# Patient Record
Sex: Male | Born: 1985 | Race: White | Hispanic: Yes | Marital: Single | Smoking: Never smoker
Health system: Southern US, Community
[De-identification: ages and names within clinical notes are randomized; demographics above are authoritative.]

## PROBLEM LIST (undated history)

## (undated) DIAGNOSIS — R001 Bradycardia, unspecified: Secondary | ICD-10-CM

## (undated) DIAGNOSIS — I498 Other specified cardiac arrhythmias: Secondary | ICD-10-CM

## (undated) DIAGNOSIS — IMO0002 Reserved for concepts with insufficient information to code with codable children: Secondary | ICD-10-CM

## (undated) HISTORY — DX: Other specified cardiac arrhythmias: I49.8

## (undated) HISTORY — PX: FOOT SURGERY: SHX648

## (undated) HISTORY — PX: KNEE SURGERY: SHX244

## (undated) HISTORY — PX: SPINAL FIXATION SURGERY W/ IMPLANT: SHX785

## (undated) HISTORY — DX: Bradycardia, unspecified: R00.1

---
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
## 2007-02-20 ENCOUNTER — Ambulatory Visit: Payer: Self-pay | Admitting: Internal Medicine

## 2007-03-19 ENCOUNTER — Encounter: Admission: RE | Admit: 2007-03-19 | Discharge: 2007-05-22 | Payer: Self-pay | Admitting: Family Medicine

## 2007-05-09 ENCOUNTER — Ambulatory Visit: Payer: Self-pay | Admitting: Internal Medicine

## 2007-10-11 ENCOUNTER — Ambulatory Visit: Payer: Self-pay | Admitting: Internal Medicine

## 2007-12-04 ENCOUNTER — Ambulatory Visit: Payer: Self-pay | Admitting: Internal Medicine

## 2007-12-04 ENCOUNTER — Encounter: Payer: Self-pay | Admitting: Internal Medicine

## 2007-12-04 LAB — CONVERTED CEMR LAB
ALT: 18 units/L (ref 0–53)
AST: 26 units/L (ref 0–37)
Albumin: 4.7 g/dL (ref 3.5–5.2)
Alkaline Phosphatase: 79 units/L (ref 39–117)
Basophils Absolute: 0 10*3/uL (ref 0.0–0.1)
Basophils Relative: 0 % (ref 0–1)
Eosinophils Absolute: 0.1 10*3/uL (ref 0.0–0.7)
Glucose, Bld: 83 mg/dL (ref 70–99)
LDL Cholesterol: 54 mg/dL (ref 0–99)
MCHC: 32.5 g/dL (ref 30.0–36.0)
MCV: 87.6 fL (ref 78.0–100.0)
Neutro Abs: 6.2 10*3/uL (ref 1.7–7.7)
Neutrophils Relative %: 69 % (ref 43–77)
Platelets: 291 10*3/uL (ref 150–400)
Potassium: 4.2 meq/L (ref 3.5–5.3)
RBC: 5.55 M/uL (ref 4.22–5.81)
RDW: 13.5 % (ref 11.5–15.5)
Sed Rate: 2 mm/hr (ref 0–16)
Sodium: 141 meq/L (ref 135–145)
Total Bilirubin: 0.5 mg/dL (ref 0.3–1.2)
Total Protein: 7.5 g/dL (ref 6.0–8.3)
Triglycerides: 57 mg/dL (ref <150)
VLDL: 11 mg/dL (ref 0–40)

## 2008-02-27 ENCOUNTER — Ambulatory Visit: Payer: Self-pay | Admitting: Internal Medicine

## 2008-06-29 ENCOUNTER — Emergency Department (HOSPITAL_COMMUNITY): Admission: EM | Admit: 2008-06-29 | Discharge: 2008-06-29 | Payer: Self-pay | Admitting: Emergency Medicine

## 2008-07-28 ENCOUNTER — Ambulatory Visit: Payer: Self-pay | Admitting: Family Medicine

## 2008-08-09 ENCOUNTER — Emergency Department (HOSPITAL_COMMUNITY): Admission: EM | Admit: 2008-08-09 | Discharge: 2008-08-09 | Payer: Self-pay | Admitting: Emergency Medicine

## 2008-10-28 ENCOUNTER — Ambulatory Visit: Payer: Self-pay | Admitting: Internal Medicine

## 2009-04-26 ENCOUNTER — Emergency Department (HOSPITAL_COMMUNITY): Admission: EM | Admit: 2009-04-26 | Discharge: 2009-04-27 | Payer: Self-pay | Admitting: Emergency Medicine

## 2009-05-26 ENCOUNTER — Encounter: Admission: RE | Admit: 2009-05-26 | Discharge: 2009-05-26 | Payer: Self-pay | Admitting: Otolaryngology

## 2009-12-31 ENCOUNTER — Ambulatory Visit: Payer: Self-pay | Admitting: Internal Medicine

## 2010-03-31 ENCOUNTER — Emergency Department (HOSPITAL_COMMUNITY)
Admission: EM | Admit: 2010-03-31 | Discharge: 2010-03-31 | Payer: Self-pay | Source: Home / Self Care | Admitting: Emergency Medicine

## 2011-04-24 ENCOUNTER — Emergency Department (HOSPITAL_COMMUNITY)
Admission: EM | Admit: 2011-04-24 | Discharge: 2011-04-24 | Disposition: A | Discharge status: Home / Self Care | Payer: Medicare Other | Attending: Emergency Medicine | Admitting: Emergency Medicine

## 2011-04-24 ENCOUNTER — Encounter: Payer: Self-pay | Admitting: Emergency Medicine

## 2011-04-24 DIAGNOSIS — H5710 Ocular pain, unspecified eye: Secondary | ICD-10-CM | POA: Insufficient documentation

## 2011-04-24 DIAGNOSIS — H5712 Ocular pain, left eye: Secondary | ICD-10-CM

## 2011-04-24 MED ORDER — FLUORESCEIN SODIUM 1 MG OP STRP
1.0000 | ORAL_STRIP | Freq: Once | OPHTHALMIC | Status: AC
  Administered 2011-04-24: 1 via OPHTHALMIC
  Filled 2011-04-24: qty 1

## 2011-04-24 MED ORDER — TETRACAINE HCL 0.5 % OP SOLN
1.0000 [drp] | Freq: Once | OPHTHALMIC | Status: AC
  Administered 2011-04-24: 1 [drp] via OPHTHALMIC
  Filled 2011-04-24: qty 2

## 2011-04-24 MED ORDER — CARBOXYMETHYLCELLUL-GLYCERIN 0.5-0.9 % OP SOLN: 2.0000 [drp] | OPHTHALMIC | Status: DC | PRN

## 2011-04-24 NOTE — ED Notes (Signed)
Patient currently sitting up in bed; no respiratory or acute distress noted.  Updated patient on plan of care; informed patient that we are waiting for the EDP to re-evaluate patient.  Patient has no other questions or concerns at this time; will continue to monitor.

## 2011-04-24 NOTE — ED Notes (Signed)
To ed for eval of left eye pain. States he has been seen by eye md for same. Also states he fell on Saturday and has left elbow and left knee pain. No decreased rom noted.

## 2011-04-24 NOTE — Progress Notes (Signed)
25 year old male comes in with irritation of his left eye. He had been seen by an eye doctor and placed on some eyedrops which he had been taking to 3 days ago. He continues to have some irritation in his left eye. Exam shows some mild erythema the conjunctiva but no corneal injury. Anterior chamber is clear. Slit-lamp exam is unremarkable. He may have some mild conjunctivitis/resolving keratitis related to his eyedrops. He will be treated symptomatically and referred back to his ophthalmologist.

## 2011-04-24 NOTE — ED Notes (Signed)
Patient given copy of discharge paperwork; went over discharge instructions with patient.  Instructed patient to apply eye drops as directed, to follow up with an eye doctor, and to return to the ED for new, worsening, or concerning symptoms.

## 2011-04-24 NOTE — ED Notes (Signed)
Informed Mahoney, resident that fluorescein and tetracaine at bedside.

## 2011-04-24 NOTE — ED Notes (Signed)
Patient currently sitting up in bed; no respiratory or acute distress noted.  Patient updated on plan of care; informed patient that we are waiting on the EDP to come and assess patient.  Patient has no other questions or concerns at this time.  Will continue to monitor.

## 2011-04-24 NOTE — ED Provider Notes (Signed)
History     CSN: [id number] Arrival date & time: 04/24/2011  3:58 PM   First MD Initiated Contact with Patient 04/24/11 1955      Chief Complaint  Patient presents with  . Eye Pain    (Consider location/radiation/quality/duration/timing/severity/associated sxs/prior treatment) Patient is a 25 y.o. male presenting with eye pain. The history is provided by the patient.  Eye Pain This is a new problem. The current episode started 1 to 4 weeks ago. The problem occurs constantly. The problem has been waxing and waning. Pertinent negatives include no abdominal pain, arthralgias, chest pain, chills, congestion, coughing, fatigue, fever, headaches, myalgias, nausea, neck pain, numbness, rash, visual change or vomiting. The symptoms are aggravated by nothing. Treatments tried: eye drops, prescribed by eye doctor. The treatment provided moderate relief.    No past medical history on file.  No past surgical history on file.  No family history on file.  History  Substance Use Topics  . Smoking status: Not on file  . Smokeless tobacco: Not on file  . Alcohol Use: Not on file      Review of Systems  Unable to perform ROS Constitutional: Negative for fever, chills and fatigue.  HENT: Negative for congestion and neck pain.   Eyes: Positive for pain and redness. Negative for photophobia, discharge, itching and visual disturbance.  Respiratory: Negative for cough and shortness of breath.   Cardiovascular: Negative for chest pain and palpitations.  Gastrointestinal: Negative for nausea, vomiting and abdominal pain.  Musculoskeletal: Negative for myalgias and arthralgias.  Skin: Negative for color change and rash.  Neurological: Negative for light-headedness, numbness and headaches.  All other systems reviewed and are negative.    Allergies  Review of patient's allergies indicates no known allergies.  Home Medications  No current outpatient prescriptions on file.  BP 118/72  Pulse  65  Temp(Src) 98.2 F (36.8 C) (Oral)  Resp 16  SpO2 98%  Physical Exam  Nursing note and vitals reviewed. Constitutional: He is oriented to person, place, and time. He appears well-developed and well-nourished.  HENT:  Head: Normocephalic and atraumatic.  Eyes: Conjunctivae and EOM are normal. Pupils are equal, round, and reactive to light. Right eye exhibits no discharge. Left eye exhibits no discharge and no exudate. No foreign body present in the left eye. No scleral icterus.  Slit lamp exam:      The right eye shows no fluorescein uptake.       The left eye shows no corneal abrasion, no corneal flare, no corneal ulcer, no foreign body, no hyphema and no hypopyon.       Mildly pronounced blood vessels in lateral portion of L eye. Pressure = 13.  Cardiovascular: Normal rate and regular rhythm.   Pulmonary/Chest: Effort normal and breath sounds normal. No respiratory distress.  Abdominal: Soft. There is no tenderness.  Neurological: He is alert and oriented to person, place, and time.  Skin: Skin is warm and dry.  Psychiatric: He has a normal mood and affect.    ED Course  Procedures (including critical care time)  Labs Reviewed - No data to display No results found.   No diagnosis found.    MDM  This is a 25 year old male who presents with left eye pain for approximately 2 weeks. Says 2 weeks ago saw a doctor for it, and was told that he had an infection of unknown type, and was prescribed an unknown eyedrop to help treat it. He said he initially had improvement of his  symptoms, but about a week later began to have recurrence of some pain and discomfort in the left eye. Says he finishes drops 3 days ago, and has not had an acute worsening after that point in time, but does notice some continued worsening of the pain and reported redness in the left eye. The patient denies any other symptoms. On exam the patient does have mildly pronounced blood vessels in the lateral portion of  the left eye, but no conjunctival injection there are no signs of foreign bodies so on exam is negative, but does have fluorescein uptake. There are no signs of periorbital infection, injuries or tenderness on exam. Unsure of exact etiology of patient's pain discomfort, however no signs of infection are seen at this time. Patient was treated with an unknown eyedrop, and certain antibiotic eyedrops the potential to cause irrigation of the eye, which may be the current explanation for the patient's symptoms. Discussed with the patient symptomatic treatment with lubricating ointment, as well as followup with the eye doctor saw him initially, for further evaluation and to ensure he is getting better. The patient expresses understanding and agreement with this plan.        Theotis Burrow, MD 04/25/11 0040

## 2011-04-24 NOTE — ED Notes (Signed)
Patient complaining of eye pain that started two weeks ago; describes pain as "burning"; patient states that when he closes his eye, it feels as if there is something stuck in his eye; reports radiation of pain into his left temporal area.  Patient was seen by a primary care doctor and was given eye drops to help with the irritation; states that the eye drops have provided little relief.  Patient was told by the primary care physician that there were no foreign bodies present in his eye.  Patient states that he is unable to sleep due to the pain and irritation in his eye.  Upon assessment, slight redness noted in sclera of the left eye.  Patient alert and oriented x4; PERRL present.  Will continue to monitor.

## 2011-04-25 NOTE — ED Provider Notes (Signed)
I saw and evaluated the patient, reviewed the resident's note and I agree with the findings and plan.   Dione Booze, MD 04/25/11 [phone]

## 2011-11-11 ENCOUNTER — Encounter (HOSPITAL_COMMUNITY): Payer: Self-pay | Admitting: Emergency Medicine

## 2011-11-11 ENCOUNTER — Emergency Department (HOSPITAL_COMMUNITY)
Admission: EM | Admit: 2011-11-11 | Discharge: 2011-11-12 | Disposition: A | Discharge status: Home / Self Care | Payer: Medicare Other | Attending: Emergency Medicine | Admitting: Emergency Medicine

## 2011-11-11 DIAGNOSIS — R29898 Other symptoms and signs involving the musculoskeletal system: Secondary | ICD-10-CM | POA: Insufficient documentation

## 2011-11-11 DIAGNOSIS — R51 Headache: Secondary | ICD-10-CM | POA: Insufficient documentation

## 2011-11-11 DIAGNOSIS — R209 Unspecified disturbances of skin sensation: Secondary | ICD-10-CM | POA: Insufficient documentation

## 2011-11-11 DIAGNOSIS — X58XXXS Exposure to other specified factors, sequela: Secondary | ICD-10-CM | POA: Insufficient documentation

## 2011-11-11 DIAGNOSIS — IMO0002 Reserved for concepts with insufficient information to code with codable children: Secondary | ICD-10-CM | POA: Insufficient documentation

## 2011-11-11 HISTORY — DX: Reserved for concepts with insufficient information to code with codable children: IMO0002

## 2011-11-11 NOTE — ED Notes (Signed)
Pt reports headache with joint pain and blurred vision denies injury to head also reports intermittant fever

## 2011-11-11 NOTE — ED Notes (Signed)
HA for months, worse on the left side and losing hair on the side that he is having HA, noted some swelling to left upper choc bone. losing vision on left eye Complain of joint pain and it is worse on left arm with some swelling to that left arm.. Forgetting a lot of stuff

## 2011-11-12 ENCOUNTER — Emergency Department (HOSPITAL_COMMUNITY): Payer: Medicare Other

## 2011-11-12 MED ORDER — KETOROLAC TROMETHAMINE 30 MG/ML IJ SOLN
30.0000 mg | Freq: Once | INTRAMUSCULAR | Status: AC
  Administered 2011-11-12: 30 mg via INTRAVENOUS

## 2011-11-12 MED ORDER — DIPHENHYDRAMINE HCL 50 MG/ML IJ SOLN
25.0000 mg | Freq: Once | INTRAMUSCULAR | Status: AC
  Administered 2011-11-12: 25 mg via INTRAVENOUS
  Filled 2011-11-12: qty 1

## 2011-11-12 MED ORDER — DEXAMETHASONE SODIUM PHOSPHATE 10 MG/ML IJ SOLN
10.0000 mg | Freq: Once | INTRAMUSCULAR | Status: AC
  Administered 2011-11-12: 10 mg via INTRAVENOUS
  Filled 2011-11-12: qty 1

## 2011-11-12 MED ORDER — METOCLOPRAMIDE HCL 5 MG/ML IJ SOLN
10.0000 mg | Freq: Once | INTRAMUSCULAR | Status: AC
  Administered 2011-11-12: 10 mg via INTRAVENOUS
  Filled 2011-11-12: qty 2

## 2011-11-12 NOTE — Discharge Instructions (Signed)
You were seen and evaluated for your symptoms of headache. Your CAT scan did not show any concerning cause of your headache in your providers feel you may return home and followup to primary care provider for continued evaluation and treatment.   Headache, General, Unknown Cause The specific cause of your headache may not have been found today. There are many causes and types of headache. A few common ones are:  Tension headache.   Migraine.   Infections (examples: dental and sinus infections).   Bone and/or joint problems in the neck or jaw.   Depression.   Eye problems.  These headaches are not life threatening.  Headaches can sometimes be diagnosed by a patient history and a physical exam. Sometimes, lab and imaging studies (such as x-ray and/or CT scan) are used to rule out more serious problems. In some cases, a spinal tap (lumbar puncture) may be requested. There are many times when your exam and tests may be normal on the first visit even when there is a serious problem causing your headaches. Because of that, it is very important to follow up with your doctor or local clinic for further evaluation. FINDING OUT THE RESULTS OF TESTS  If a radiology test was performed, a radiologist will review your results.   You will be contacted by the emergency department or your physician if any test results require a change in your treatment plan.   Not all test results may be available during your visit. If your test results are not back during the visit, make an appointment with your caregiver to find out the results. Do not assume everything is normal if you have not heard from your caregiver or the medical facility. It is important for you to follow up on all of your test results.  HOME CARE INSTRUCTIONS   Keep follow-up appointments with your caregiver, or any specialist referral.   Only take over-the-counter or prescription medicines for pain, discomfort, or fever as directed by your  caregiver.   Biofeedback, massage, or other relaxation techniques may be helpful.   Ice packs or heat applied to the head and neck can be used. Do this three to four times per day, or as needed.   Call your doctor if you have any questions or concerns.   If you smoke, you should quit.  SEEK MEDICAL CARE IF:   You develop problems with medications prescribed.   You do not respond to or obtain relief from medications.   You have a change from the usual headache.   You develop nausea or vomiting.  SEEK IMMEDIATE MEDICAL CARE IF:   If your headache becomes severe.   You have an unexplained oral temperature above 102 F (38.9 C), or as your caregiver suggests.   You have a stiff neck.   You have loss of vision.   You have muscular weakness.   You have loss of muscular control.   You develop severe symptoms different from your first symptoms.   You start losing your balance or have trouble walking.   You feel faint or pass out.  MAKE SURE YOU:   Understand these instructions.   Will watch your condition.   Will get help right away if you are not doing well or get worse.  Document Released: 05/08/2005 Document Revised: 04/27/2011 Document Reviewed: 12/26/2007 [facility] Patient Information 2012 [place], [place].     RESOURCE GUIDE  Chronic Pain Problems: Contact [facility]  [phone] Patients need to be  referred by their primary care doctor.  Insufficient Money for Medicine: Contact United Way:  call "211" or [facility] [phone].  No Primary Care Doctor: - Call [facility]  [phone] - can help you locate a primary care doctor that  accepts your insurance, provides certain services, etc. - Physician Referral Service- [phone]  Agencies that provide inexpensive medical care: - [facility]  [phone] - [facility]  [phone] - [facility]  [phone] - Women's Clinic   [phone] - Planned Parenthood  [phone] [facility]  [phone]  Medicaid-accepting [facility] Providers: - [facility]- [address]  [phone], Mon-Fri 9am-7pm, Sat 9am-1pm - [facility]- [address] [place], [address] [place]  [phone] - [facility]- [address] [place]  [phone] [facility]- [address]  [phone] - Renaye Rakers- [address] [place], [address], [phone]  Only accepts [place] patients after they have their name  applied to their card  Self Pay (no insurance) in [place]: - Sickle Cell Patients: Dr Willey Blade, [facility]  [address] [place], [phone] - [facility]- [address] [place]  [phone]       [facility]- [address]       -     [facility]- see information above (Speak to [employer] if you do not have insurance)       -  [facility]- [address] [place], [phone]       -  [facility]- [address],  [phone]       -  [facility]- [address], [phone]       -  Dr Julio Sicks-  [address], [place], [phone]       -  [facility]- [address], [phone]       -  [facility]- [address], [phone], also [address] [phone]       -    [facility]- [address] [place], [phone], 1st & 3rd Saturday   every month, 10am-1pm  1) Find a Doctor and Pay Out of Pocket Although you won't have to find out who is covered by your insurance plan, it is a good idea to ask around and get recommendations. You will then need to call the office and see if the doctor you have chosen will accept you as a new patient and what types of options they offer for patients who are self-pay. Some doctors offer discounts or will set up payment plans for their patients  who do not have insurance, but you will need to ask so you aren't surprised when you get to your appointment.  2) Contact Your Local Health Department Not all health departments have doctors that can see patients for sick visits, but many do, so it is worth a call to see if yours does. If you don't know where your local health department is, you can check in your phone book. The CDC also has a tool to help you locate your state's health department, and many state websites also have listings of all of their local health departments.  3) Find a Walk-in Clinic If your illness  is not likely to be very severe or complicated, you may want to try a walk in clinic. These are popping up all over the country in pharmacies, drugstores, and shopping centers. They're usually staffed by nurse practitioners or physician assistants that have been trained to treat common illnesses and complaints. They're usually fairly quick and inexpensive. However, if you have serious medical issues or chronic medical problems, these are probably not your best option  STD Testing - [facility] Department of [facility], STD Clinic, [address] [place], phone [phone] or [phone].  Monday - Friday, call for an appointment. [facility] Department of [employer], STD Clinic, [address], [place], phone [phone] or [phone].  Monday - Friday, call for an appointment.  Abuse/Neglect: [facility] Child Abuse Hotline 629 626 5020 [facility] Child Abuse Hotline 978-344-8782 (After Hours)  Emergency Shelter:  [facility] 769-318-2730  Maternity Homes: - [facility] [facility] [phone] - [facility] [phone]  MRSA Hotline #:   9176650190  [facility] Resources  [facility] [place]  United [employer] [facility] Dept. [address]                 [address]          [address] [place] [address]  Blondell Reveal Phone:  [phone]                                  Phone:  [phone]                   Phone:  [phone]  [facility], [phone] - [phone]       -     [facility] in [place], [address],                                  7376156941, [facility] Child Abuse Hotline 714-610-6532 or (561)758-6350 (After Hours)   Behavioral Health Services  Substance Abuse Resources: - [facility] Drug [facility]  6205165420 - [facility] [phone] - [facility] [place] [phone] Floydene Flock [phone] - Residential & Outpatient Substance Abuse Program  [phone]  Psychological Services: [facility]  [phone] Services  [phone] - [facility], [phone] [place]. [address] [place], ACCESS LINE: [phone] or [phone], [employer]  Dental Assistance  If unable to pay or uninsured, contact:  [facility] or [facility] to become qualified for the adult dental clinic.  Patients with Medicaid: [facility] [phone] [address], [phone] [address], [phone]  If unable to pay, or uninsured, contact [facility] [phone]) or [facility] Department [phone] in [place], [phone] in [place]) to become qualified for the adult dental clinic  Other Proofreader Services: - [facility]- [address] [place], [place], [place], [address], [phone], Ext. [phone], 2nd and 4th Thursday of the month at 6:30am.  10 clients each day by appointment, can sometimes see walk-in patients if someone does not show for an appointment. [facility]- [address] Ether Griffins [place],  [place], [address], [phone] - [facility]- [address] [place], [place], [address], [phone] - [place] [facility]- [phone] [facility] Department- [phone] [facility] Department- [phone]

## 2011-11-12 NOTE — ED Provider Notes (Signed)
Medical screening examination/treatment/procedure(s) were performed by non-physician practitioner and as supervising physician I was immediately available for consultation/collaboration.  Breyonna Nault, MD 11/12/11 0730 
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
## 2012-06-17 ENCOUNTER — Encounter: Payer: Self-pay | Admitting: Internal Medicine

## 2012-07-03 ENCOUNTER — Other Ambulatory Visit (HOSPITAL_COMMUNITY): Payer: Self-pay | Admitting: Cardiology

## 2012-07-03 DIAGNOSIS — R0602 Shortness of breath: Secondary | ICD-10-CM

## 2012-07-03 DIAGNOSIS — I493 Ventricular premature depolarization: Secondary | ICD-10-CM

## 2012-07-03 DIAGNOSIS — R002 Palpitations: Secondary | ICD-10-CM

## 2012-07-09 ENCOUNTER — Ambulatory Visit (HOSPITAL_COMMUNITY)
Admission: RE | Admit: 2012-07-09 | Discharge: 2012-07-09 | Disposition: A | Discharge status: Home / Self Care | Payer: Medicare Other | Source: Ambulatory Visit | Attending: Cardiology | Admitting: Cardiology

## 2012-07-09 DIAGNOSIS — R002 Palpitations: Secondary | ICD-10-CM

## 2012-07-09 DIAGNOSIS — I493 Ventricular premature depolarization: Secondary | ICD-10-CM

## 2012-07-09 DIAGNOSIS — R0602 Shortness of breath: Secondary | ICD-10-CM

## 2012-07-09 DIAGNOSIS — I059 Rheumatic mitral valve disease, unspecified: Secondary | ICD-10-CM | POA: Insufficient documentation

## 2012-07-09 NOTE — Progress Notes (Signed)
[facility]   2D echo completed 07/09/2012.   Cindy Olivya Sobol, RDCS  

## 2012-09-17 ENCOUNTER — Encounter: Payer: Self-pay | Admitting: Internal Medicine

## 2012-09-17 ENCOUNTER — Ambulatory Visit (INDEPENDENT_AMBULATORY_CARE_PROVIDER_SITE_OTHER): Payer: Medicare Other | Admitting: Internal Medicine

## 2012-09-17 VITALS — BP 120/71 | HR 60 | Ht 62.0 in | Wt 109.2 lb

## 2012-09-17 DIAGNOSIS — I4949 Other premature depolarization: Secondary | ICD-10-CM

## 2012-09-17 DIAGNOSIS — R002 Palpitations: Secondary | ICD-10-CM

## 2012-09-17 DIAGNOSIS — I493 Ventricular premature depolarization: Secondary | ICD-10-CM

## 2012-09-17 NOTE — Assessment & Plan Note (Signed)
He has maintained NSR.  I suspect the etiology of his symptoms is the PVC's that he has demonstrated on his cardiac monitor. I have discussed the benign nature of the symptoms and have recommended watchful waiting. I have asked him to call for frank syncope.
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
## 2012-09-17 NOTE — Patient Instructions (Addendum)
Your physician recommends that you schedule a follow-up appointment as needed  

## 2012-09-30 ENCOUNTER — Encounter: Payer: Self-pay | Admitting: Internal Medicine

## 2012-10-04 ENCOUNTER — Emergency Department (HOSPITAL_COMMUNITY): Payer: Medicare Other

## 2012-10-04 ENCOUNTER — Emergency Department (HOSPITAL_COMMUNITY)
Admission: EM | Admit: 2012-10-04 | Discharge: 2012-10-04 | Disposition: A | Discharge status: Home / Self Care | Payer: Medicare Other | Attending: Emergency Medicine | Admitting: Emergency Medicine

## 2012-10-04 ENCOUNTER — Encounter (HOSPITAL_COMMUNITY): Payer: Self-pay | Admitting: Cardiology

## 2012-10-04 DIAGNOSIS — S99929A Unspecified injury of unspecified foot, initial encounter: Secondary | ICD-10-CM | POA: Insufficient documentation

## 2012-10-04 DIAGNOSIS — M7989 Other specified soft tissue disorders: Secondary | ICD-10-CM

## 2012-10-04 DIAGNOSIS — S8990XA Unspecified injury of unspecified lower leg, initial encounter: Secondary | ICD-10-CM | POA: Insufficient documentation

## 2012-10-04 DIAGNOSIS — M660 Rupture of popliteal cyst: Secondary | ICD-10-CM

## 2012-10-04 DIAGNOSIS — R296 Repeated falls: Secondary | ICD-10-CM | POA: Insufficient documentation

## 2012-10-04 DIAGNOSIS — Y9389 Activity, other specified: Secondary | ICD-10-CM | POA: Insufficient documentation

## 2012-10-04 DIAGNOSIS — Z87828 Personal history of other (healed) physical injury and trauma: Secondary | ICD-10-CM | POA: Insufficient documentation

## 2012-10-04 DIAGNOSIS — M25561 Pain in right knee: Secondary | ICD-10-CM

## 2012-10-04 DIAGNOSIS — Y929 Unspecified place or not applicable: Secondary | ICD-10-CM | POA: Insufficient documentation

## 2012-10-04 DIAGNOSIS — M7120 Synovial cyst of popliteal space [Baker], unspecified knee: Secondary | ICD-10-CM | POA: Insufficient documentation

## 2012-10-04 MED ORDER — PREDNISONE 20 MG PO TABS: ORAL_TABLET | ORAL | Status: DC

## 2012-10-04 MED ORDER — IBUPROFEN 800 MG PO TABS: 800.0000 mg | ORAL_TABLET | Freq: Three times a day (TID) | ORAL | Status: DC
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
## 2012-10-04 NOTE — Progress Notes (Signed)
VASCULAR LAB PRELIMINARY  PRELIMINARY  PRELIMINARY  PRELIMINARY  Right lower extremity venous duplex completed.    Preliminary report:  Right:  No evidence of DVTor superficial thrombosis. There is an area of mixed echoes extending from the popliteal fossa to the mid calf consistent with a possible ruptured Baker's cyst.  Cecille Mcclusky, RVS 10/04/2012, 1:39 PM

## 2012-10-04 NOTE — ED Notes (Signed)
Pt dc to home. Pt sts understanding to dc instructions. Pt ambulatory to exit without difficulty.  Pt denies need for w/c.  

## 2012-10-04 NOTE — ED Notes (Signed)
Pt reports on Saturday he fell and has been having pain that aches mostly in the right leg. States that he has also had some numbness in both legs intermittently. Reports a shooting pain in the right leg from the knee to the hip.

## 2012-10-04 NOTE — ED Notes (Signed)
Pt returned from vascular and xray

## 2012-10-24 ENCOUNTER — Encounter (HOSPITAL_COMMUNITY): Payer: Self-pay | Admitting: Emergency Medicine

## 2012-10-24 ENCOUNTER — Emergency Department (HOSPITAL_COMMUNITY): Payer: Medicare Other

## 2012-10-24 ENCOUNTER — Emergency Department (HOSPITAL_COMMUNITY)
Admission: EM | Admit: 2012-10-24 | Discharge: 2012-10-24 | Disposition: A | Discharge status: Home / Self Care | Payer: Medicare Other | Attending: Emergency Medicine | Admitting: Emergency Medicine

## 2012-10-24 DIAGNOSIS — R209 Unspecified disturbances of skin sensation: Secondary | ICD-10-CM | POA: Insufficient documentation

## 2012-10-24 DIAGNOSIS — Z8739 Personal history of other diseases of the musculoskeletal system and connective tissue: Secondary | ICD-10-CM | POA: Insufficient documentation

## 2012-10-24 DIAGNOSIS — M25569 Pain in unspecified knee: Secondary | ICD-10-CM | POA: Insufficient documentation

## 2012-10-24 DIAGNOSIS — M25562 Pain in left knee: Secondary | ICD-10-CM

## 2012-10-24 DIAGNOSIS — R296 Repeated falls: Secondary | ICD-10-CM | POA: Insufficient documentation

## 2012-10-24 DIAGNOSIS — Y998 Other external cause status: Secondary | ICD-10-CM | POA: Insufficient documentation

## 2012-10-24 DIAGNOSIS — Z79899 Other long term (current) drug therapy: Secondary | ICD-10-CM | POA: Insufficient documentation

## 2012-10-24 DIAGNOSIS — Y92009 Unspecified place in unspecified non-institutional (private) residence as the place of occurrence of the external cause: Secondary | ICD-10-CM | POA: Insufficient documentation

## 2012-10-24 MED ORDER — NAPROXEN 375 MG PO TABS: 375.0000 mg | ORAL_TABLET | Freq: Two times a day (BID) | ORAL | Status: DC

## 2012-10-24 MED ORDER — IBUPROFEN 200 MG PO TABS
600.0000 mg | ORAL_TABLET | Freq: Once | ORAL | Status: AC
  Administered 2012-10-24: 600 mg via ORAL
  Filled 2012-10-24: qty 3

## 2012-10-24 NOTE — ED Notes (Signed)
Report received from Berkley,RN 

## 2012-10-24 NOTE — ED Notes (Signed)
Pt reports he fell onto the ground 1 week ago and is c/o anterior left leg pain below knee.  Pt is able to move leg, but c/o pain with movement.  Pt is able to ambulate.
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
## 2012-10-24 NOTE — ED Notes (Signed)
PA at bedside.

## 2012-10-26 NOTE — ED Provider Notes (Signed)
Medical screening examination/treatment/procedure(s) were performed by non-physician practitioner and as supervising physician I was immediately available for consultation/collaboration.    Vida Roller, MD 10/26/12 [phone]

## 2013-10-02 ENCOUNTER — Ambulatory Visit: Payer: Medicare Other | Attending: Physician Assistant

## 2013-10-02 DIAGNOSIS — IMO0001 Reserved for inherently not codable concepts without codable children: Secondary | ICD-10-CM | POA: Insufficient documentation

## 2013-10-02 DIAGNOSIS — M6281 Muscle weakness (generalized): Secondary | ICD-10-CM | POA: Insufficient documentation

## 2013-10-02 DIAGNOSIS — M2550 Pain in unspecified joint: Secondary | ICD-10-CM | POA: Diagnosis not present

## 2013-10-02 DIAGNOSIS — R269 Unspecified abnormalities of gait and mobility: Secondary | ICD-10-CM | POA: Diagnosis not present

## 2013-10-09 ENCOUNTER — Ambulatory Visit: Payer: Medicare Other | Admitting: Physical Therapy

## 2013-10-09 DIAGNOSIS — IMO0001 Reserved for inherently not codable concepts without codable children: Secondary | ICD-10-CM | POA: Diagnosis not present

## 2013-10-10 ENCOUNTER — Ambulatory Visit: Payer: Medicare Other

## 2013-10-14 ENCOUNTER — Ambulatory Visit: Payer: Medicare Other | Admitting: Physical Therapy

## 2013-10-16 ENCOUNTER — Ambulatory Visit: Payer: Medicare Other

## 2013-10-16 DIAGNOSIS — IMO0001 Reserved for inherently not codable concepts without codable children: Secondary | ICD-10-CM | POA: Diagnosis not present

## 2013-10-29 ENCOUNTER — Ambulatory Visit: Payer: Medicare Other | Attending: Physician Assistant | Admitting: Physical Therapy

## 2013-10-29 DIAGNOSIS — M2550 Pain in unspecified joint: Secondary | ICD-10-CM | POA: Diagnosis not present

## 2013-10-29 DIAGNOSIS — R269 Unspecified abnormalities of gait and mobility: Secondary | ICD-10-CM | POA: Diagnosis not present

## 2013-10-29 DIAGNOSIS — IMO0001 Reserved for inherently not codable concepts without codable children: Secondary | ICD-10-CM | POA: Diagnosis not present

## 2013-10-29 DIAGNOSIS — M6281 Muscle weakness (generalized): Secondary | ICD-10-CM | POA: Diagnosis not present

## 2013-11-02 ENCOUNTER — Emergency Department (HOSPITAL_COMMUNITY)
Admission: EM | Admit: 2013-11-02 | Discharge: 2013-11-02 | Disposition: A | Discharge status: Home / Self Care | Payer: Medicare Other | Attending: Emergency Medicine | Admitting: Emergency Medicine

## 2013-11-02 ENCOUNTER — Emergency Department (HOSPITAL_COMMUNITY): Payer: Medicare Other

## 2013-11-02 ENCOUNTER — Encounter (HOSPITAL_COMMUNITY): Payer: Self-pay | Admitting: Emergency Medicine

## 2013-11-02 DIAGNOSIS — S8000XA Contusion of unspecified knee, initial encounter: Secondary | ICD-10-CM | POA: Insufficient documentation

## 2013-11-02 DIAGNOSIS — Z792 Long term (current) use of antibiotics: Secondary | ICD-10-CM | POA: Diagnosis not present

## 2013-11-02 DIAGNOSIS — Z87828 Personal history of other (healed) physical injury and trauma: Secondary | ICD-10-CM | POA: Insufficient documentation

## 2013-11-02 DIAGNOSIS — S8990XA Unspecified injury of unspecified lower leg, initial encounter: Secondary | ICD-10-CM | POA: Diagnosis present

## 2013-11-02 DIAGNOSIS — Y929 Unspecified place or not applicable: Secondary | ICD-10-CM | POA: Diagnosis not present

## 2013-11-02 DIAGNOSIS — Y9389 Activity, other specified: Secondary | ICD-10-CM | POA: Insufficient documentation

## 2013-11-02 DIAGNOSIS — W010XXA Fall on same level from slipping, tripping and stumbling without subsequent striking against object, initial encounter: Secondary | ICD-10-CM | POA: Insufficient documentation

## 2013-11-02 DIAGNOSIS — S8002XA Contusion of left knee, initial encounter: Secondary | ICD-10-CM

## 2013-11-02 DIAGNOSIS — W19XXXA Unspecified fall, initial encounter: Secondary | ICD-10-CM

## 2013-11-02 DIAGNOSIS — S8001XA Contusion of right knee, initial encounter: Secondary | ICD-10-CM

## 2013-11-02 DIAGNOSIS — Z791 Long term (current) use of non-steroidal anti-inflammatories (NSAID): Secondary | ICD-10-CM | POA: Diagnosis not present

## 2013-11-02 DIAGNOSIS — S99929A Unspecified injury of unspecified foot, initial encounter: Secondary | ICD-10-CM | POA: Diagnosis present

## 2013-11-02 MED ORDER — IBUPROFEN 400 MG PO TABS
800.0000 mg | ORAL_TABLET | Freq: Once | ORAL | Status: AC
  Administered 2013-11-02: 800 mg via ORAL
  Filled 2013-11-02: qty 2

## 2013-11-02 MED ORDER — IBUPROFEN 800 MG PO TABS: 800.0000 mg | ORAL_TABLET | Freq: Three times a day (TID) | ORAL | Status: DC

## 2013-11-02 NOTE — ED Notes (Signed)
Pt reports he slipped and fell . Pt reports knee pain on the RT after fall. Pt also reports lesser pain on Lt knee.

## 2013-11-02 NOTE — Discharge Instructions (Signed)
Call for a follow up appointment with a Family or Primary Care Provider.  Call your orthopedic specialist for further evaluation of your knee injury. Return if Symptoms worsen.   Take medication as prescribed.  Ice your knees 3-4 times a day.
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
## 2013-11-04 NOTE — ED Provider Notes (Signed)
Medical screening examination/treatment/procedure(s) were performed by non-physician practitioner and as supervising physician I was immediately available for consultation/collaboration.   Shanna CiscoMegan E Docherty, MD 11/04/13 [phone]

## 2013-11-05 ENCOUNTER — Ambulatory Visit: Payer: Medicare Other

## 2013-11-05 DIAGNOSIS — IMO0001 Reserved for inherently not codable concepts without codable children: Secondary | ICD-10-CM | POA: Diagnosis not present

## 2013-11-18 ENCOUNTER — Encounter: Payer: Medicare Other | Admitting: Physical Therapy

## 2013-11-20 ENCOUNTER — Ambulatory Visit: Payer: Medicare Other

## 2013-12-10 ENCOUNTER — Emergency Department (HOSPITAL_COMMUNITY)
Admission: EM | Admit: 2013-12-10 | Discharge: 2013-12-10 | Disposition: A | Discharge status: Home / Self Care | Payer: Medicare Other | Attending: Emergency Medicine | Admitting: Emergency Medicine

## 2013-12-10 ENCOUNTER — Encounter (HOSPITAL_COMMUNITY): Payer: Self-pay | Admitting: Emergency Medicine

## 2013-12-10 DIAGNOSIS — Z87828 Personal history of other (healed) physical injury and trauma: Secondary | ICD-10-CM | POA: Diagnosis not present

## 2013-12-10 DIAGNOSIS — L738 Other specified follicular disorders: Secondary | ICD-10-CM | POA: Diagnosis not present

## 2013-12-10 DIAGNOSIS — L678 Other hair color and hair shaft abnormalities: Secondary | ICD-10-CM | POA: Diagnosis not present

## 2013-12-10 DIAGNOSIS — L739 Follicular disorder, unspecified: Secondary | ICD-10-CM

## 2013-12-10 DIAGNOSIS — R21 Rash and other nonspecific skin eruption: Secondary | ICD-10-CM | POA: Diagnosis present

## 2013-12-10 MED ORDER — MUPIROCIN CALCIUM 2 % EX CREA: 1.0000 "application " | TOPICAL_CREAM | Freq: Two times a day (BID) | CUTANEOUS | Status: AC

## 2013-12-10 MED ORDER — CETIRIZINE HCL 10 MG PO CAPS: 10.0000 mg | ORAL_CAPSULE | Freq: Every evening | ORAL | Status: AC | PRN
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
## 2013-12-10 NOTE — Discharge Instructions (Signed)

## 2013-12-10 NOTE — ED Notes (Signed)
The patient said he began getting rash sincce last week, the bumps are on both side of his back, his chest and also on his legs.  He says they itch and burn.  He says he has been sweating a lot so that might have something to do with it.  He denies having any pets just birds, and he did add that he has been sleeping on the floor.

## 2013-12-11 NOTE — ED Provider Notes (Signed)
Medical screening examination/treatment/procedure(s) were performed by non-physician practitioner and as supervising physician I was immediately available for consultation/collaboration.   EKG Interpretation None       Jeremy SouSam Samentha Perham, MD 12/11/13 [phone]

## 2013-12-24 IMAGING — CR DG KNEE COMPLETE 4+V*L*
4 series · 4 of 4 positions shown · non-contrast
Comparison: 06/29/2008..

CLINICAL DATA: Fall.

LEFT KNEE - COMPLETE 4+ VIEW

[t knee ap left]
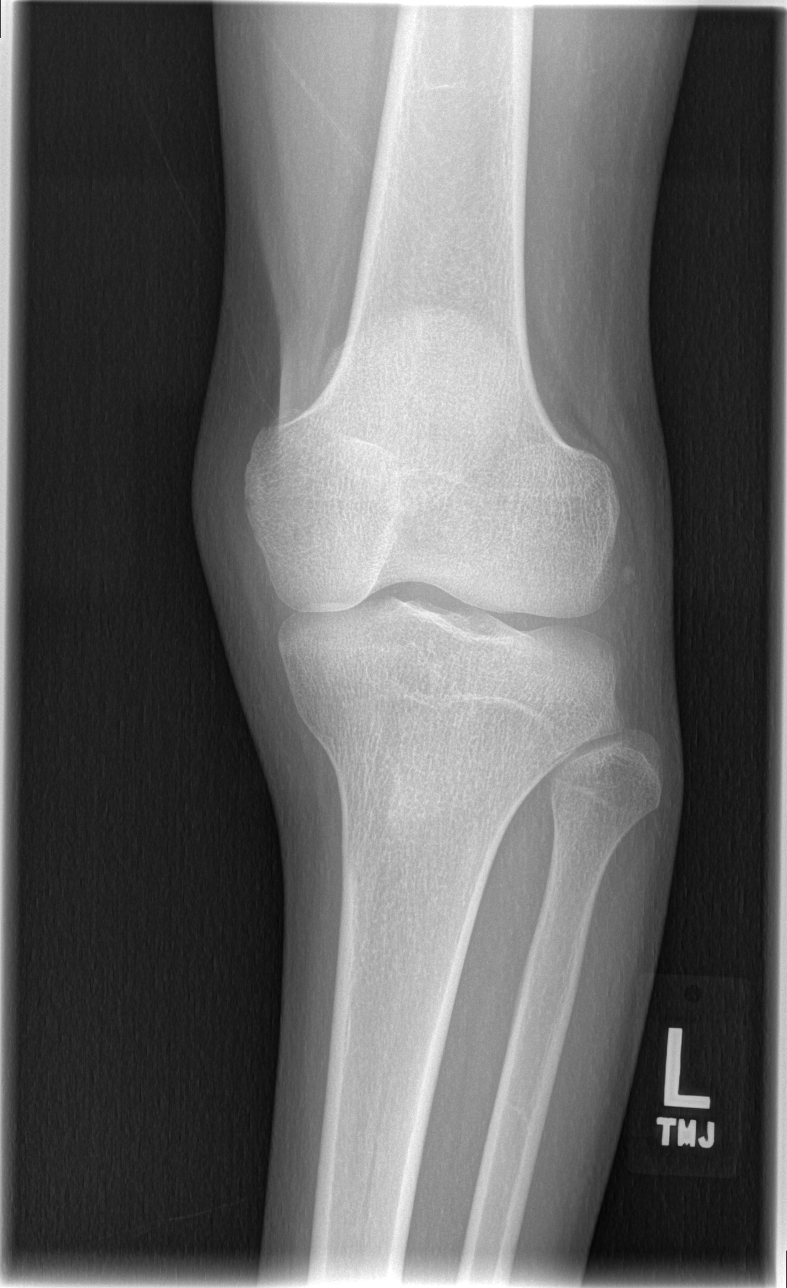

[t knee oblique left (1 of 2)]
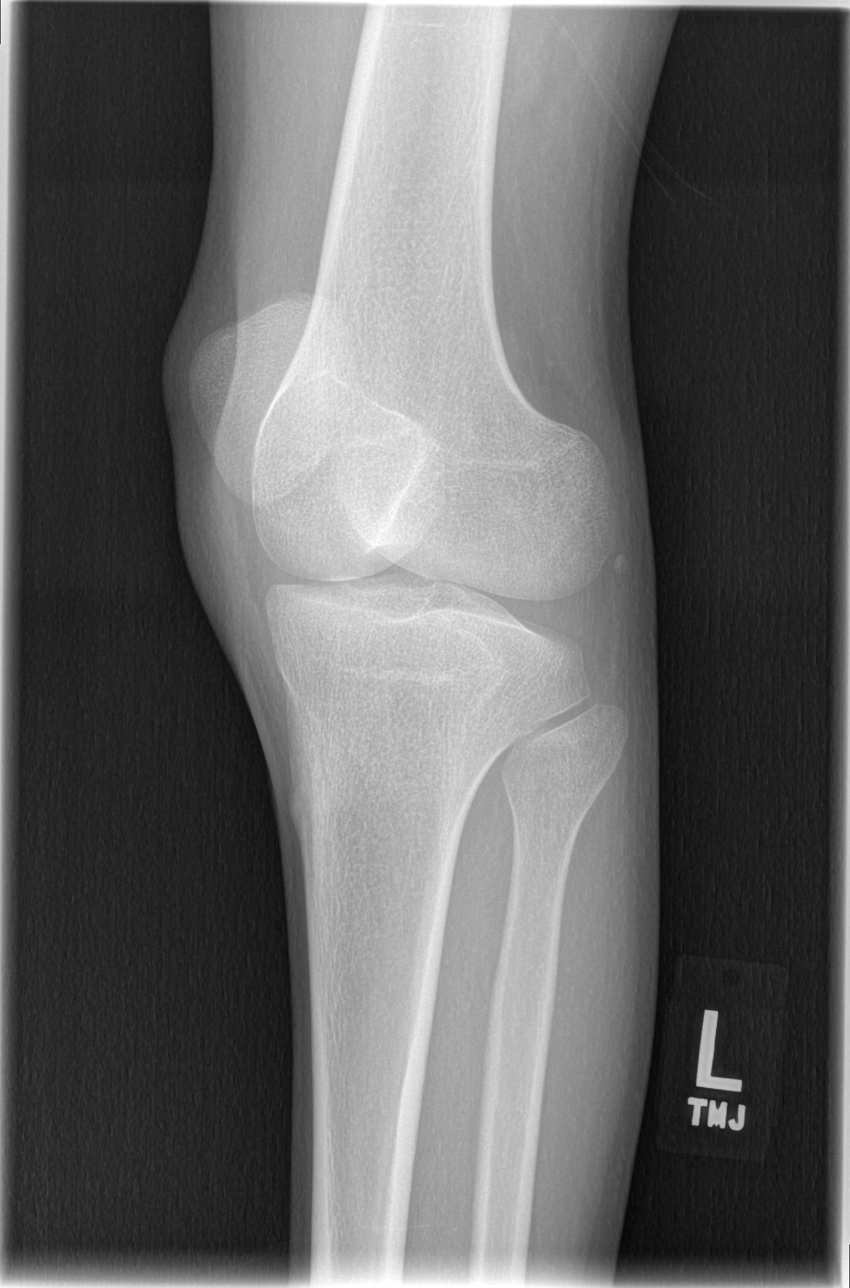

[t knee oblique left (2 of 2)]
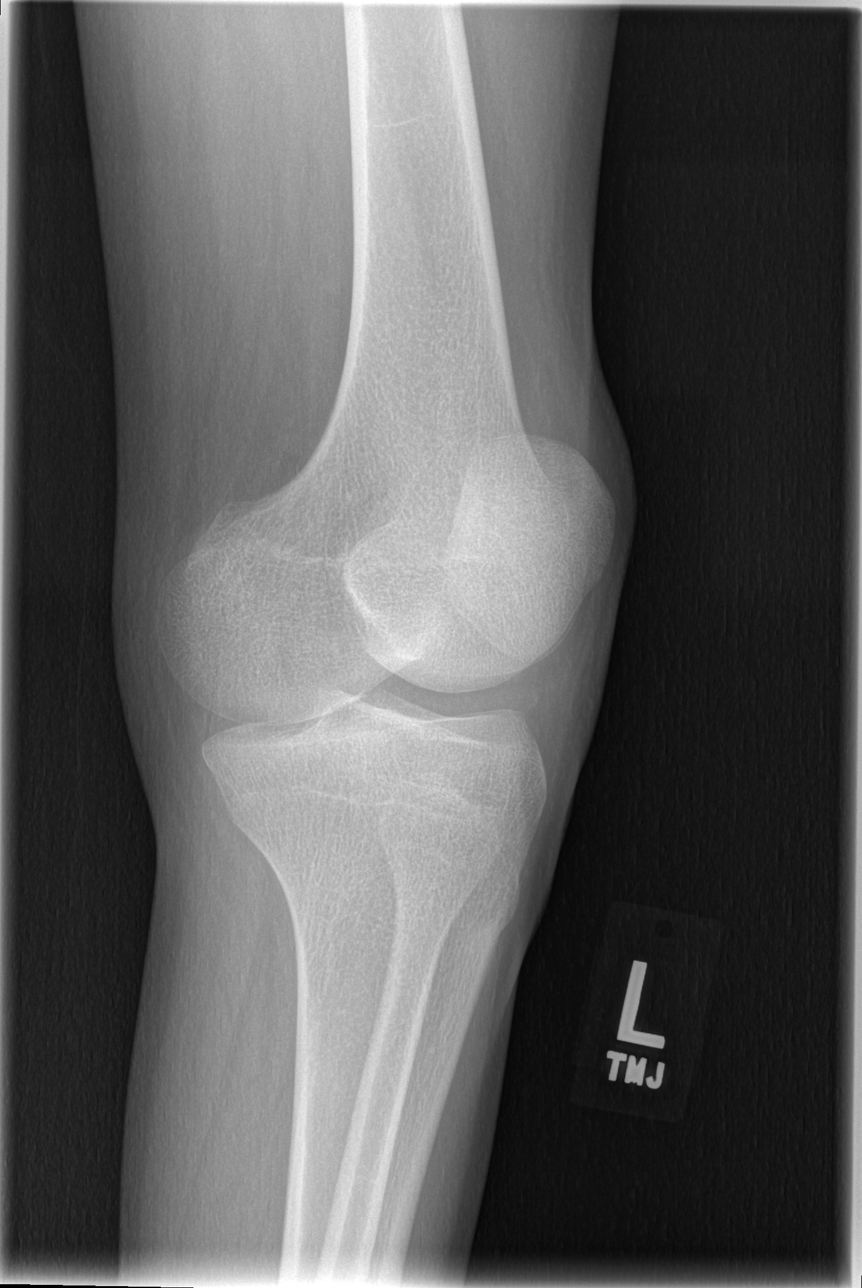

[t knee lat left]
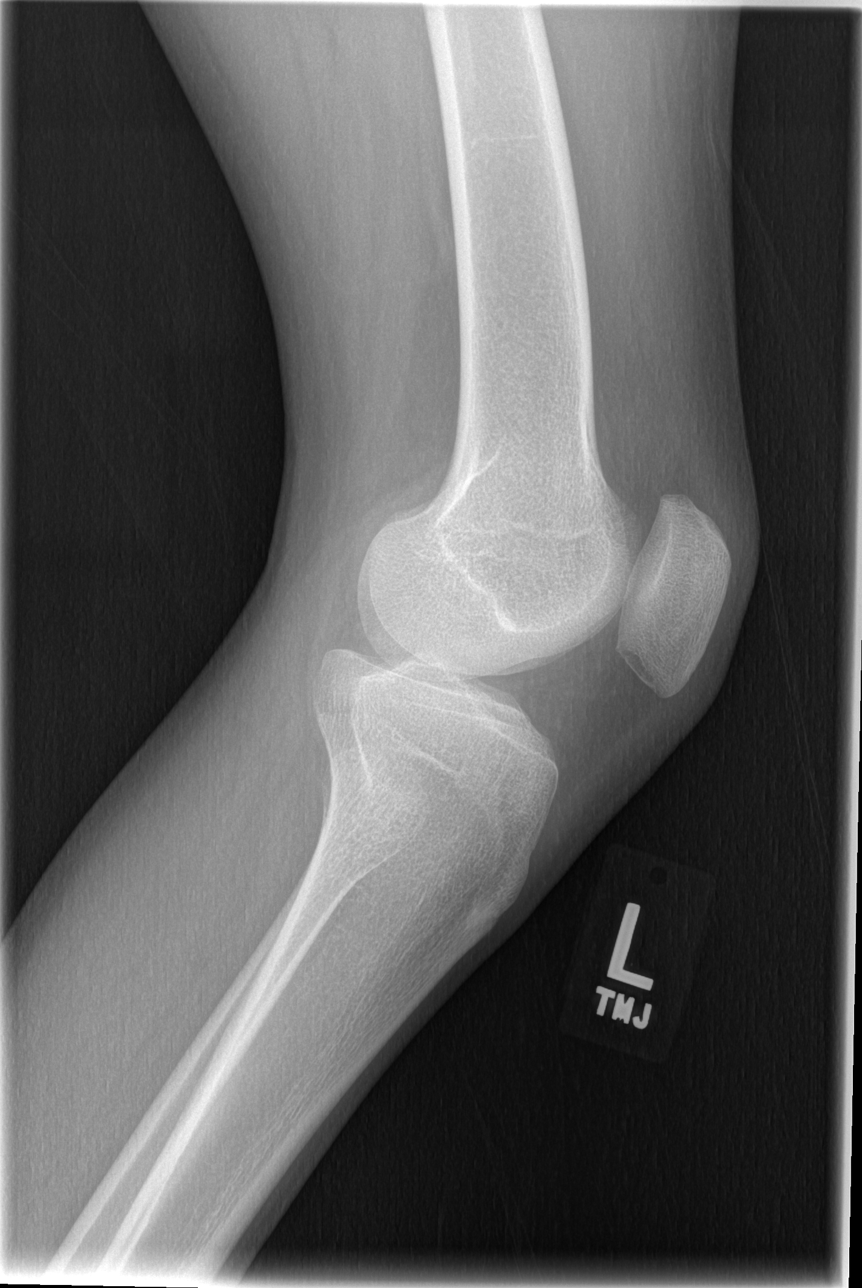

[4 of 4 positions shown; findings below may reference images not displayed]

FINDINGS: No fracture or dislocation.
IMPRESSION: No fracture.

## 2013-12-24 IMAGING — CR DG KNEE COMPLETE 4+V*R*
4 series · 4 of 4 positions shown · non-contrast
Comparison: None.

CLINICAL DATA: Loss of balance and fell.

RIGHT KNEE - COMPLETE 4+ VIEW

[t knee ap right]
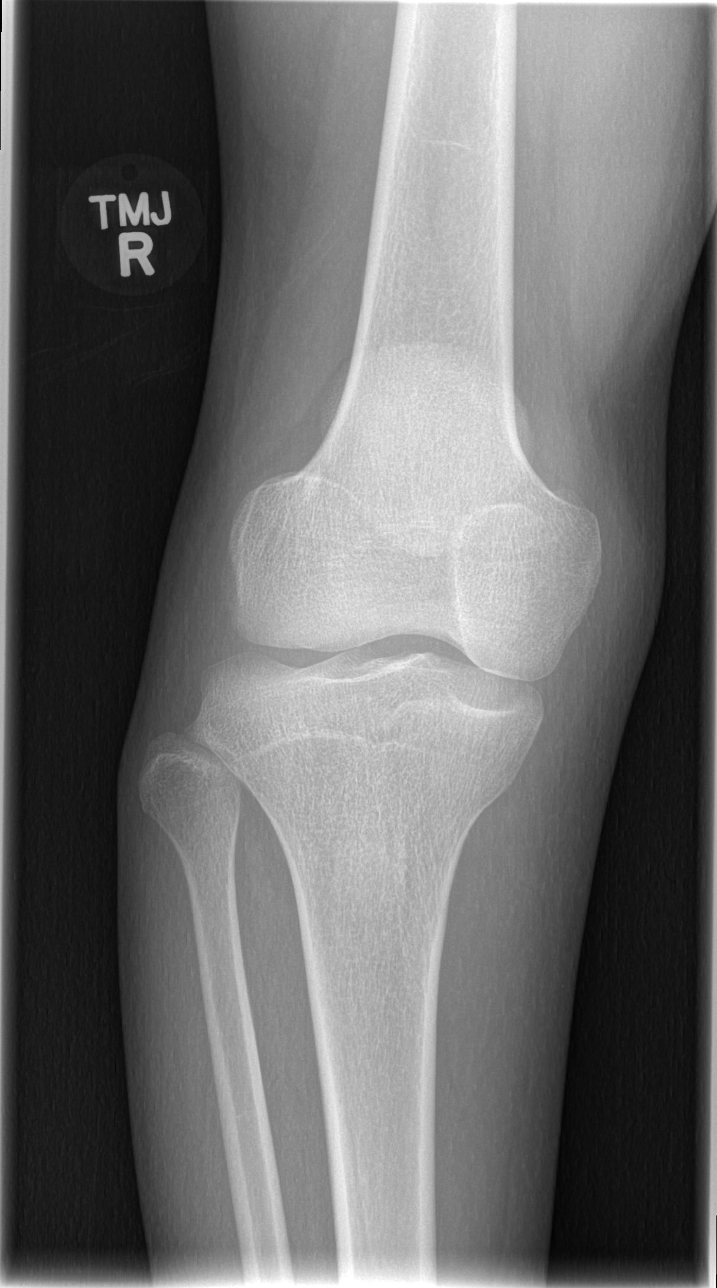

[t knee oblique right (1 of 2)]
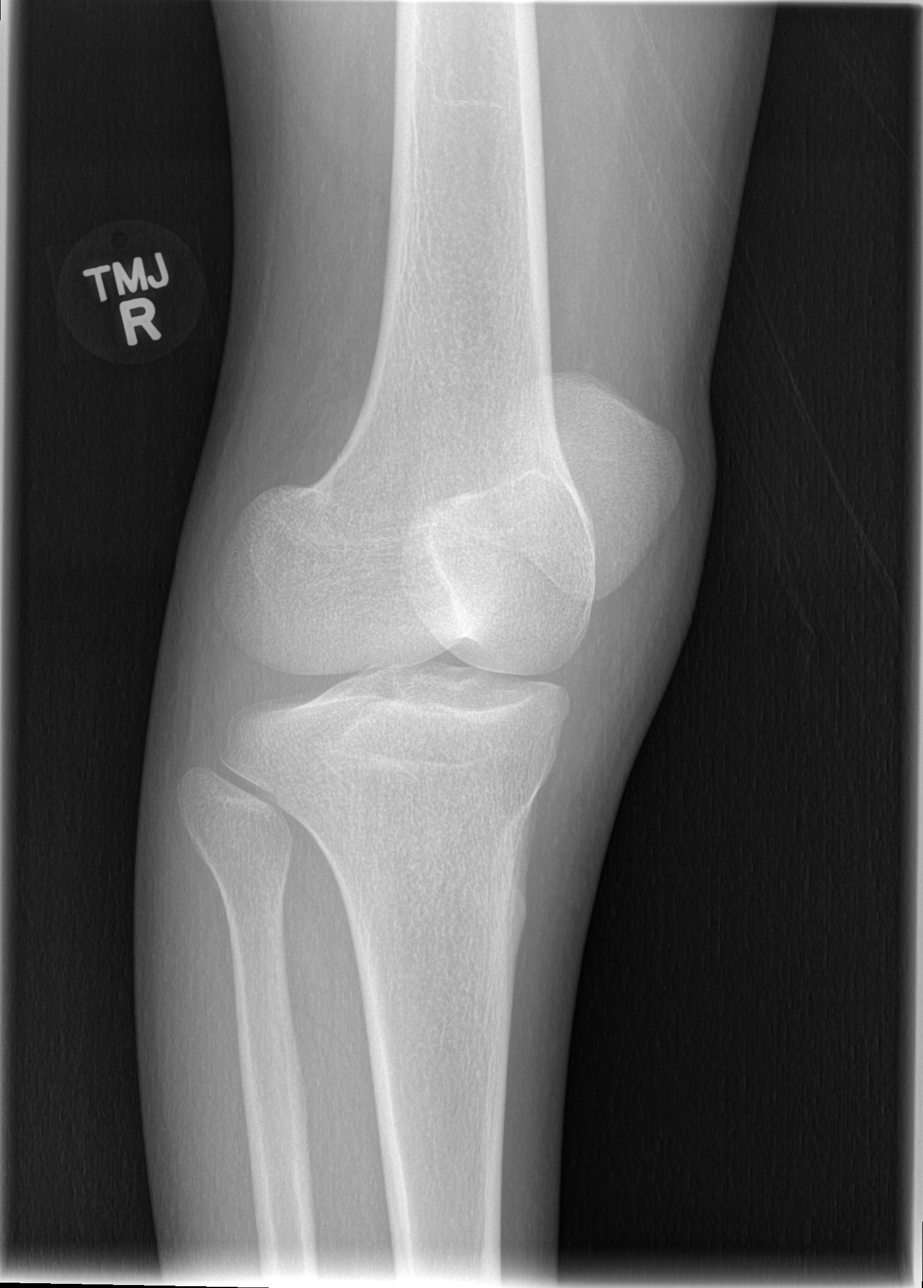

[t knee oblique right (2 of 2)]
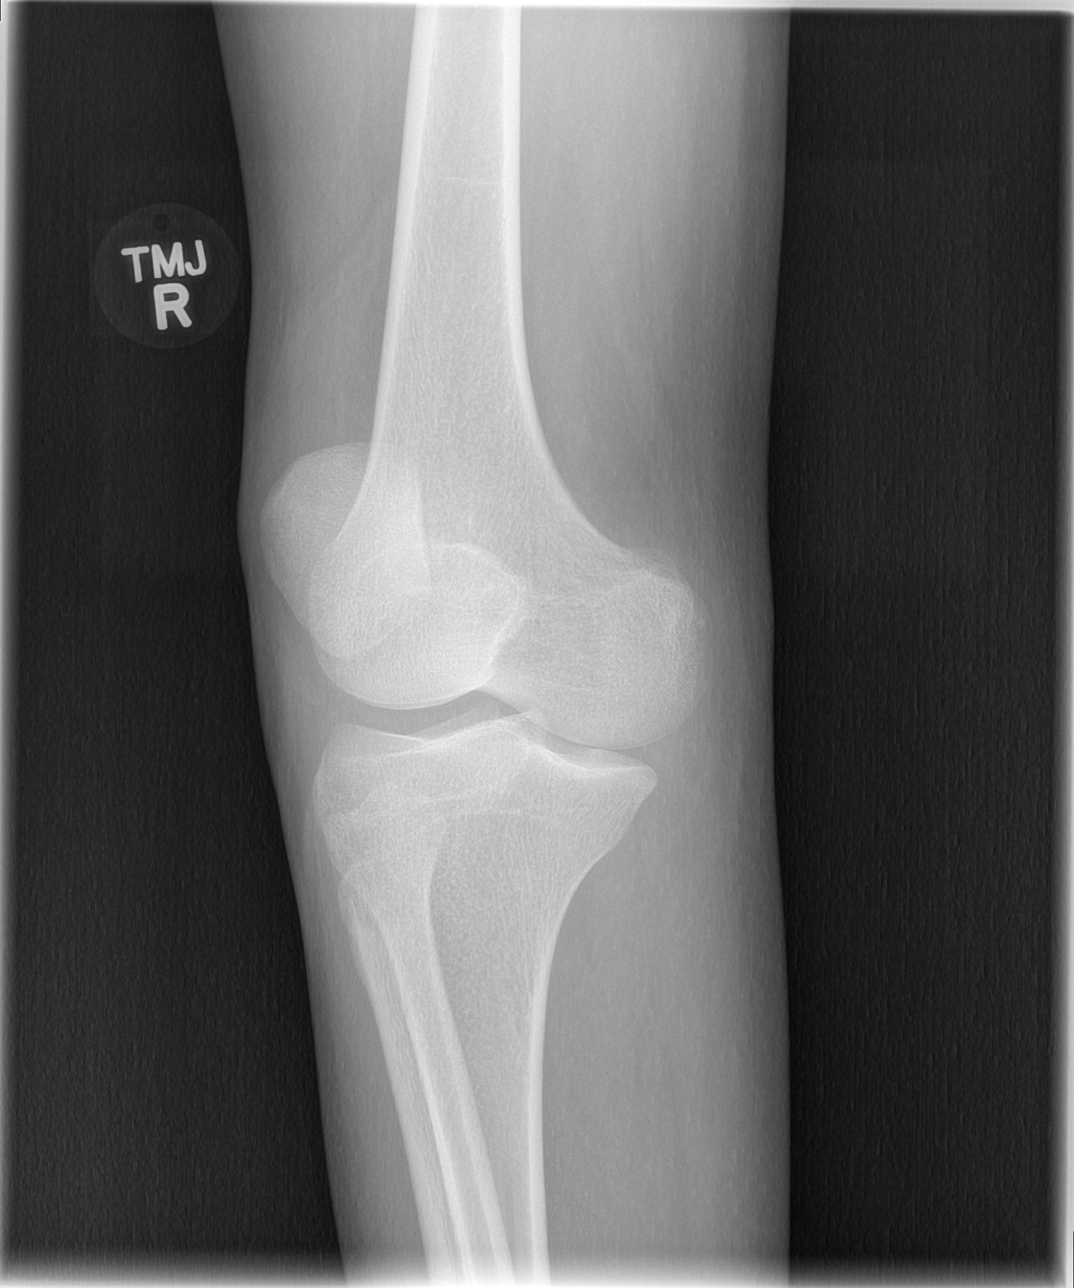

[t knee lat right]
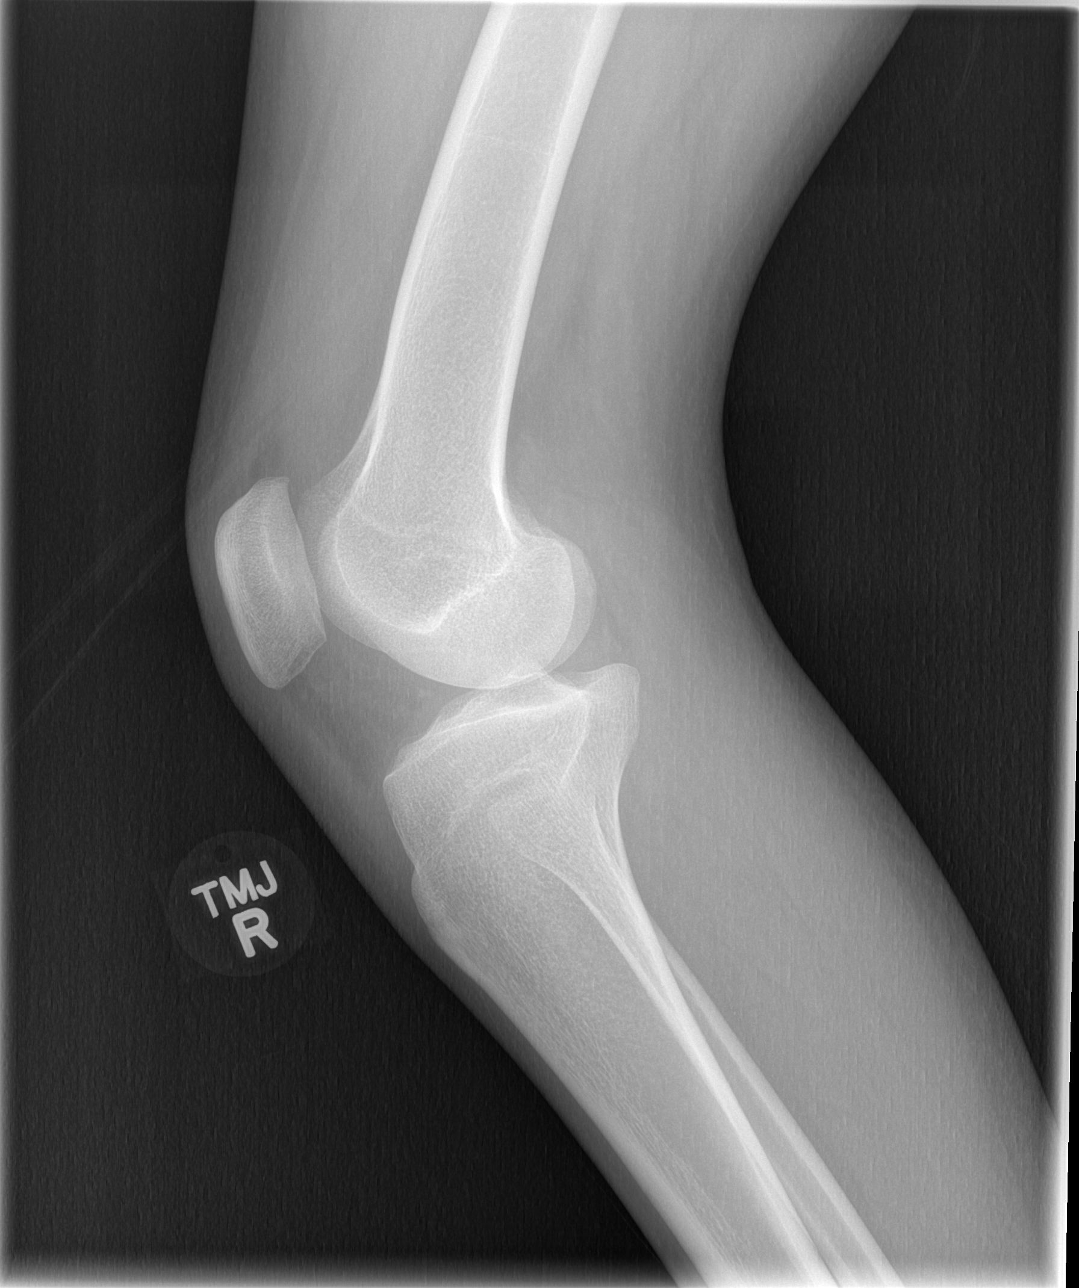

[4 of 4 positions shown; findings below may reference images not displayed]

FINDINGS: No fracture or dislocation.  Findings suggest presence of
a suprapatellar joint effusion.
IMPRESSION: No fracture or dislocation.

Suggestion of joint effusion.

## 2014-09-18 ENCOUNTER — Emergency Department (HOSPITAL_COMMUNITY): Payer: Medicare Other

## 2014-09-18 ENCOUNTER — Emergency Department (HOSPITAL_COMMUNITY)
Admission: EM | Admit: 2014-09-18 | Discharge: 2014-09-18 | Disposition: A | Discharge status: Home / Self Care | Payer: Medicare Other | Attending: Emergency Medicine | Admitting: Emergency Medicine

## 2014-09-18 ENCOUNTER — Encounter (HOSPITAL_COMMUNITY): Payer: Self-pay

## 2014-09-18 DIAGNOSIS — S9032XA Contusion of left foot, initial encounter: Secondary | ICD-10-CM | POA: Diagnosis not present

## 2014-09-18 DIAGNOSIS — Y999 Unspecified external cause status: Secondary | ICD-10-CM | POA: Diagnosis not present

## 2014-09-18 DIAGNOSIS — Y939 Activity, unspecified: Secondary | ICD-10-CM | POA: Insufficient documentation

## 2014-09-18 DIAGNOSIS — S92302A Fracture of unspecified metatarsal bone(s), left foot, initial encounter for closed fracture: Secondary | ICD-10-CM

## 2014-09-18 DIAGNOSIS — S92355A Nondisplaced fracture of fifth metatarsal bone, left foot, initial encounter for closed fracture: Secondary | ICD-10-CM | POA: Insufficient documentation

## 2014-09-18 DIAGNOSIS — Y92007 Garden or yard of unspecified non-institutional (private) residence as the place of occurrence of the external cause: Secondary | ICD-10-CM | POA: Insufficient documentation

## 2014-09-18 DIAGNOSIS — W1839XA Other fall on same level, initial encounter: Secondary | ICD-10-CM | POA: Diagnosis not present

## 2014-09-18 DIAGNOSIS — S99912A Unspecified injury of left ankle, initial encounter: Secondary | ICD-10-CM | POA: Diagnosis present

## 2014-09-18 DIAGNOSIS — Z792 Long term (current) use of antibiotics: Secondary | ICD-10-CM | POA: Diagnosis not present

## 2014-09-18 MED ORDER — HYDROCODONE-ACETAMINOPHEN 5-325 MG PO TABS
1.0000 | ORAL_TABLET | Freq: Once | ORAL | Status: AC
  Administered 2014-09-18: 1 via ORAL
  Filled 2014-09-18: qty 1

## 2014-09-18 NOTE — Progress Notes (Signed)
Orthopedic Tech Progress Note Patient Details:  Jeremy Pope 11/12/1985 [id number] Applied fiberglass posterior short leg splint to LLE.  Pulses, sensation, motion intact before and after splinting.  Capillary refill less than 2 seconds before and after splinting. Ortho Devices Type of Ortho Device: Short leg splint Ortho Device/Splint Location: LLE Ortho Device/Splint Interventions: Application   Lesle ChrisGilliland, Ian Castagna L 09/18/2014, 9:11 PM

## 2014-09-18 NOTE — ED Notes (Signed)
Pt in xray
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
## 2014-09-18 NOTE — Discharge Instructions (Signed)
Read the information below.  You may return to the Emergency Department at any time for worsening condition or any new symptoms that concern you.  If you develop uncontrolled pain, weakness or numbness of the extremity, severe discoloration of the skin, return to the ER for a recheck.      Cast or Splint Care Casts and splints support injured limbs and keep bones from moving while they heal.  HOME CARE  Keep the cast or splint uncovered during the drying period.  A plaster cast can take 24 to 48 hours to dry.  A fiberglass cast will dry in less than 1 hour.  Do not rest the cast on anything harder than a pillow for 24 hours.  Do not put weight on your injured limb. Do not put pressure on the cast. Wait for your doctor's approval.  Keep the cast or splint dry.  Cover the cast or splint with a plastic bag during baths or wet weather.  If you have a cast over your chest and belly (trunk), take sponge baths until the cast is taken off.  If your cast gets wet, dry it with a towel or blow dryer. Use the cool setting on the blow dryer.  Keep your cast or splint clean. Wash a dirty cast with a damp cloth.  Do not put any objects under your cast or splint.  Do not scratch the skin under the cast with an object. If itching is a problem, use a blow dryer on a cool setting over the itchy area.  Do not trim or cut your cast.  Do not take out the padding from inside your cast.  Exercise your joints near the cast as told by your doctor.  Raise (elevate) your injured limb on 1 or 2 pillows for the first 1 to 3 days. GET HELP IF:  Your cast or splint cracks.  Your cast or splint is too tight or too loose.  You itch badly under the cast.  Your cast gets wet or has a soft spot.  You have a bad smell coming from the cast.  You get an object stuck under the cast.  Your skin around the cast becomes red or sore.  You have new or more pain after the cast is put on. GET HELP RIGHT AWAY  IF:  You have fluid leaking through the cast.  You cannot move your fingers or toes.  Your fingers or toes turn blue or white or are cool, painful, or puffy (swollen).  You have tingling or lose feeling (numbness) around the injured area.  You have bad pain or pressure under the cast.  You have trouble breathing or have shortness of breath.  You have chest pain. Document Released: 09/07/2010 Document Revised: 01/08/2013 Document Reviewed: 11/14/2012 [facility] Patient Information 2015 [place], [place]. This information is not intended to replace advice given to you by your health care provider. Make sure you discuss any questions you have with your health care provider.  Metatarsal Fracture, Undisplaced A metatarsal fracture is a break in the bone(s) of the foot. These are the bones of the foot that connect your toes to the bones of the ankle. DIAGNOSIS  The diagnoses of these fractures are usually made with X-rays. If there are problems in the forefoot and x-rays are normal a later bone scan will usually make the diagnosis.  TREATMENT AND HOME CARE INSTRUCTIONS  Treatment may or may not include a cast or walking shoe. When casts are needed the  use is usually for short periods of time so as not to slow down healing with muscle wasting (atrophy).  Activities should be stopped until further advised by your caregiver.  Wear shoes with adequate shock absorbing capabilities and stiff soles.  Alternative exercise may be undertaken while waiting for healing. These may include bicycling and swimming, or as your caregiver suggests.  It is important to keep all follow-up visits or specialty referrals. The failure to keep these appointments could result in improper bone healing and chronic pain or disability.  Warning: Do not drive a car or operate a motor vehicle until your caregiver specifically tells you it is safe to do so. IF YOU DO NOT HAVE A CAST OR SPLINT:  You may walk on your injured  foot as tolerated or advised.  Do not put any weight on your injured foot for as long as directed by your caregiver. Slowly increase the amount of time you walk on the foot as the pain allows or as advised.  Use crutches until you can bear weight without pain. A gradual increase in weight bearing may help.  Apply ice to the injury for 15-20 minutes each hour while awake for the first 2 days. Put the ice in a plastic bag and place a towel between the bag of ice and your skin.  Only take over-the-counter or prescription medicines for pain, discomfort, or fever as directed by your caregiver. SEEK IMMEDIATE MEDICAL CARE IF:   Your cast gets damaged or breaks.  You have continued severe pain or more swelling than you did before the cast was put on, or the pain is not controlled with medications.  Your skin or nails below the injury turn blue or grey, or feel cold or numb.  There is a bad smell, or new stains or pus-like (purulent) drainage coming from the cast. MAKE SURE YOU:   Understand these instructions.  Will watch your condition.  Will get help right away if you are not doing well or get worse. Document Released: 01/28/2002 Document Revised: 07/31/2011 Document Reviewed: 12/20/2007 [facility] Patient Information 2015 [place], [place]. This information is not intended to replace advice given to you by your health care provider. Make sure you discuss any questions you have with your health care provider.

## 2014-09-18 NOTE — ED Notes (Signed)
Pt. Jeremy SeatFell in his yard on Sunday and  Injured his lt. Ankle and foot. Pain radiates into his lt. Knee. Lt. Ankle and foot is swollen and ecchymotic.  Lt. Knee hans no swelling .

## 2014-12-11 ENCOUNTER — Encounter: Payer: Self-pay | Admitting: *Deleted

## 2015-01-12 ENCOUNTER — Encounter: Payer: Self-pay | Admitting: Cardiology

## 2015-10-21 ENCOUNTER — Emergency Department (HOSPITAL_COMMUNITY)
Admission: EM | Admit: 2015-10-21 | Discharge: 2015-10-21 | Disposition: A | Discharge status: Home / Self Care | Payer: Medicare Other | Attending: Emergency Medicine | Admitting: Emergency Medicine

## 2015-10-21 ENCOUNTER — Encounter (HOSPITAL_COMMUNITY): Payer: Self-pay

## 2015-10-21 ENCOUNTER — Emergency Department (HOSPITAL_COMMUNITY): Payer: Medicare Other

## 2015-10-21 DIAGNOSIS — M79604 Pain in right leg: Secondary | ICD-10-CM

## 2015-10-21 DIAGNOSIS — M7989 Other specified soft tissue disorders: Secondary | ICD-10-CM | POA: Diagnosis present

## 2015-10-21 DIAGNOSIS — M25561 Pain in right knee: Secondary | ICD-10-CM | POA: Insufficient documentation

## 2015-10-21 NOTE — Discharge Instructions (Signed)
Please ice your leg for 20 minutes at a time. Take Ibuprofren 400mg  three times a day for the next 3 days until swelling goes down. If swelling is worsening or you develop worsening pain, please come back to the Emergency Room for re-evaluation.

## 2015-10-21 NOTE — ED Notes (Signed)
PA at bedside updating pt 

## 2015-10-21 NOTE — ED Notes (Signed)
Patient here with right lower leg bruise/swelling that he noticed today, denies trauma
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
## 2015-10-21 NOTE — ED Notes (Addendum)
Patient able to ambulate independently with forearm crutches

## 2015-10-21 NOTE — ED Notes (Signed)
Pt given gown to change 

## 2019-06-17 ENCOUNTER — Ambulatory Visit (HOSPITAL_COMMUNITY): Admission: EM | Admit: 2019-06-17 | Discharge: 2019-06-17 | Discharge status: Absent without leave | Payer: Self-pay

## 2019-06-17 ENCOUNTER — Encounter (HOSPITAL_COMMUNITY): Payer: Self-pay | Admitting: Emergency Medicine

## 2019-06-17 ENCOUNTER — Other Ambulatory Visit: Payer: Self-pay

## 2019-06-17 ENCOUNTER — Ambulatory Visit (HOSPITAL_COMMUNITY)
Admission: EM | Admit: 2019-06-17 | Discharge: 2019-06-17 | Disposition: A | Discharge status: Home / Self Care | Payer: Medicare Other | Attending: Urgent Care | Admitting: Urgent Care

## 2019-06-17 DIAGNOSIS — M5442 Lumbago with sciatica, left side: Secondary | ICD-10-CM | POA: Diagnosis not present

## 2019-06-17 DIAGNOSIS — Z9889 Other specified postprocedural states: Secondary | ICD-10-CM

## 2019-06-17 MED ORDER — PREDNISONE 20 MG PO TABS: ORAL_TABLET | ORAL | 0 refills | Status: AC

## 2019-06-17 NOTE — ED Triage Notes (Signed)
Pt here with lower back pain with radiation to left leg
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
## 2021-05-17 ENCOUNTER — Emergency Department (HOSPITAL_COMMUNITY)
Admission: EM | Admit: 2021-05-17 | Discharge: 2021-05-18 | Disposition: A | Discharge status: Home / Self Care | Payer: Medicare Other | Attending: Emergency Medicine | Admitting: Emergency Medicine

## 2021-05-17 ENCOUNTER — Encounter (HOSPITAL_COMMUNITY): Payer: Self-pay | Admitting: Emergency Medicine

## 2021-05-17 ENCOUNTER — Other Ambulatory Visit: Payer: Self-pay

## 2021-05-17 DIAGNOSIS — X58XXXA Exposure to other specified factors, initial encounter: Secondary | ICD-10-CM | POA: Diagnosis not present

## 2021-05-17 DIAGNOSIS — Y9289 Other specified places as the place of occurrence of the external cause: Secondary | ICD-10-CM | POA: Diagnosis not present

## 2021-05-17 DIAGNOSIS — Z5321 Procedure and treatment not carried out due to patient leaving prior to being seen by health care provider: Secondary | ICD-10-CM | POA: Insufficient documentation

## 2021-05-17 DIAGNOSIS — T25221A Burn of second degree of right foot, initial encounter: Secondary | ICD-10-CM | POA: Insufficient documentation

## 2021-05-17 NOTE — ED Triage Notes (Signed)
Patient noticed a blister at his right foot this evening , etiology unknown . Denies injury or fall .
